# Patient Record
Sex: Male | Born: 1985 | Race: White | Hispanic: No | Marital: Married | Smoking: Former smoker
Health system: Southern US, Community
[De-identification: ages and names within clinical notes are randomized; demographics above are authoritative.]

## PROBLEM LIST (undated history)

## (undated) HISTORY — PX: NO PAST SURGERIES: SHX2092

---

## 2014-12-08 ENCOUNTER — Other Ambulatory Visit: Payer: Self-pay

## 2014-12-08 ENCOUNTER — Ambulatory Visit
Admission: EM | Admit: 2014-12-08 | Discharge: 2014-12-08 | Disposition: A | Discharge status: Home / Self Care | Payer: BLUE CROSS/BLUE SHIELD | Attending: Family Medicine | Admitting: Family Medicine

## 2014-12-08 DIAGNOSIS — L259 Unspecified contact dermatitis, unspecified cause: Secondary | ICD-10-CM

## 2014-12-08 DIAGNOSIS — R030 Elevated blood-pressure reading, without diagnosis of hypertension: Secondary | ICD-10-CM

## 2014-12-08 DIAGNOSIS — R21 Rash and other nonspecific skin eruption: Secondary | ICD-10-CM | POA: Diagnosis present

## 2014-12-08 DIAGNOSIS — IMO0001 Reserved for inherently not codable concepts without codable children: Secondary | ICD-10-CM

## 2014-12-08 MED ORDER — METHYLPREDNISOLONE SODIUM SUCC 125 MG IJ SOLR
62.5000 mg | Freq: Once | INTRAMUSCULAR | Status: AC
  Administered 2014-12-08: 62.5 mg via INTRAMUSCULAR

## 2014-12-08 MED ORDER — PREDNISONE 10 MG PO TABS: 20.0000 mg | ORAL_TABLET | Freq: Every day | ORAL | Status: AC

## 2014-12-08 NOTE — ED Provider Notes (Signed)
Patient presents today with very itchy red rash on his left arm and left torso area. He also has a lesion starting on his face. He states that he was moving some brush last week when the rash started. He has been trying to use some over-the-counter medication but has not helped much. He does admit to similar rash with poison ivy in the past. Patient does have elevated blood pressure in the office that he attributes to being nervous being at the doctor. He is also tachycardic which he states is for the same reason. He denies any chest pain or shortness of breath or any severe headache. He denies any slurred speech or weakness of any extremities. He admits to drinking a cup of coffee before coming into the office today. He denies any other drug use.  ROS: Negative except mentioned above. Vitals as per Epic.   GENERAL: NAD HEENT: no pharyngeal erythema, no exudate, no erythema of TMs, no cervical LAD RESP: CTA B CARD: tachycardic SKIN: erythematous raised slightly warm weeping area on left forearm, similar rash not weeping on left torso area and left face, no signs of secondary infection at this time  NEURO: CN II-XII grossly intact   ECG: HR 106, Sinus tachycardia, normal axis, no ST or T wave changes   A/P: 1)Skin Rash- appears to be contact dermatitis, Solu-Medrol 62.5 mg IM given in the office, will start oral steroid medication tomorrow, continuing antihistamine daily, would recommend that patient follow up tomorrow or Friday for recheck to make sure rash is improving and there is no secondary bacterial infection developing. 2) Elevated blood pressure- patient believes this is due to his nervousness in getting an injection today, also may be related to the caffeine that he had prior to coming in, would recommend that patient follow-up tomorrow for interval change of rash and also to have blood pressure rechecked. If blood pressure is still elevated would recommend follow-up with cardiology as  discussed with patient today. If any cardiac symptoms do develop in the interim he is to seek immediate medical attention.  Jolene Provost, MD 12/08/14 [phone]

## 2014-12-08 NOTE — ED Notes (Signed)
Moving brush/trees last week. + rash left arm and wrist that is very itchy. Various spots on other areas of body

## 2016-02-02 ENCOUNTER — Ambulatory Visit (INDEPENDENT_AMBULATORY_CARE_PROVIDER_SITE_OTHER): Payer: BLUE CROSS/BLUE SHIELD

## 2016-02-02 ENCOUNTER — Ambulatory Visit
Admission: EM | Admit: 2016-02-02 | Discharge: 2016-02-02 | Disposition: A | Discharge status: Home / Self Care | Payer: BLUE CROSS/BLUE SHIELD | Attending: Emergency Medicine | Admitting: Emergency Medicine

## 2016-02-02 DIAGNOSIS — J189 Pneumonia, unspecified organism: Secondary | ICD-10-CM

## 2016-02-02 DIAGNOSIS — J181 Lobar pneumonia, unspecified organism: Secondary | ICD-10-CM

## 2016-02-02 MED ORDER — BENZONATATE 200 MG PO CAPS: 200.0000 mg | ORAL_CAPSULE | Freq: Three times a day (TID) | ORAL | 0 refills | Status: AC | PRN

## 2016-02-02 MED ORDER — AZITHROMYCIN 250 MG PO TABS: 250.0000 mg | ORAL_TABLET | Freq: Every day | ORAL | 0 refills | Status: AC

## 2016-02-02 MED ORDER — IPRATROPIUM BROMIDE 0.06 % NA SOLN: 2.0000 | Freq: Four times a day (QID) | NASAL | 0 refills | Status: AC

## 2016-02-02 MED ORDER — AEROCHAMBER PLUS MISC: 2 refills | Status: AC

## 2016-02-02 MED ORDER — HYDROCOD POLST-CPM POLST ER 10-8 MG/5ML PO SUER: 5.0000 mL | Freq: Two times a day (BID) | ORAL | 0 refills | Status: AC | PRN

## 2016-02-02 MED ORDER — ALBUTEROL SULFATE HFA 108 (90 BASE) MCG/ACT IN AERS: 2.0000 | INHALATION_SPRAY | RESPIRATORY_TRACT | 0 refills | Status: AC | PRN

## 2016-02-02 NOTE — ED Provider Notes (Signed)
HPI  SUBJECTIVE:  Bryan Wilkins is a 30 y.o. male who presents with like symptoms starting 10 days ago. He reports acute onset of body aches, headaches, nasal congestion, fevers, MAXIMUM TEMPERATURE 101.8. He reports cough productive of yellowish sputum. He states that he got better, then got worse 3 days ago. Reports persistent cough, states the other things have largely resolved except for the nasal congestion. He reports one episode of posttussive emesis last night. He denies abdominal pain, and diarrhea, other nausea. No vomiting today. He is tolerating by mouth today. He denies wheezing, chest pain, shortness of breath. Reports chest soreness with coughing, no tightness, heaviness, pressure or pleuritic pain. No ear pain, headache, ST, neck stiffness, photophobia, sinus pain or pressure, calf pain, swelling, hemoptysis, surgery in the past 4 weeks, prolonged immobilization. He has been taking NyQuil, ibuprofen 400 mg, Aleve, Delsym. Symptoms are better with Aleve, no aggravating factors. He has no past medical history of asthma, emphysema, COPD, smoking, PE, DVT, diabetes. He states that his blood pressure normally runs 130/80's at home, says his heart rate and blood pressure is always high when he sees a doctor. States that the 120s is not unusual for him. PMD: None.   History reviewed. No pertinent past medical history.  Past Surgical History:  Procedure Laterality Date  . NO PAST SURGERIES      Family History  Problem Relation Age of Onset  . Hypertension Mother   . Rheum arthritis Mother   . Diabetes Father     Social History  Substance Use Topics  . Smoking status: Former Games developermoker  . Smokeless tobacco: Current User  . Alcohol use Yes     Comment: socially    No current facility-administered medications for this encounter.   Current Outpatient Prescriptions:  .  albuterol (PROVENTIL HFA;VENTOLIN HFA) 108 (90 Base) MCG/ACT inhaler, Inhale 2 puffs into the lungs every 4 (four)  hours as needed for wheezing or shortness of breath. Dispense with aerochamber, Disp: 1 Inhaler, Rfl: 0 .  azithromycin (ZITHROMAX) 250 MG tablet, Take 1 tablet (250 mg total) by mouth daily. 2 tabs po on day 1, 1 tab po on days 2-5, Disp: 6 tablet, Rfl: 0 .  benzonatate (TESSALON) 200 MG capsule, Take 1 capsule (200 mg total) by mouth 3 (three) times daily as needed for cough., Disp: 20 capsule, Rfl: 0 .  chlorpheniramine-HYDROcodone (TUSSIONEX PENNKINETIC ER) 10-8 MG/5ML SUER, Take 5 mLs by mouth every 12 (twelve) hours as needed for cough., Disp: 120 mL, Rfl: 0 .  ipratropium (ATROVENT) 0.06 % nasal spray, Place 2 sprays into both nostrils 4 (four) times daily. 3-4 times/ day, Disp: 15 mL, Rfl: 0 .  Spacer/Aero-Holding Chambers (AEROCHAMBER PLUS) inhaler, Use as instructed, Disp: 1 each, Rfl: 2  No Known Allergies   ROS  As noted in HPI.   Physical Exam  BP (!) 159/93 (BP Location: Left Arm)   Pulse (!) 124   Temp 98.2 F (36.8 C) (Oral)   Resp 17   Ht 6' (1.829 m)   Wt 280 lb (127 kg)   SpO2 96%   BMI 37.97 kg/m   Constitutional: Well developed, well nourished, no acute distress Eyes: PERRL, EOMI, conjunctiva normal bilaterally HENT: Normocephalic, atraumatic,mucus membranes moist. TMs not visualized due to cerumen. Mild nasal congestion, no sinus tenderness. Normal oropharynx. No postnasal drip noted.  Respiratory: Diffuse wheezing and rales. No chest wall tenderness good air movement Cardiovascular: Regular tachycardia no murmurs, no gallops, no rubs GI: Soft, nondistended,  normal bowel sounds, nontender, no rebound, no guarding Back: no CVAT skin: No rash, skin intact Musculoskeletal: Calves symmetric, nontender no edema, no tenderness, no deformities Neurologic: Alert & oriented x 3, CN II-XII grossly intact, no motor deficits, sensation grossly intact Psychiatric: Speech and behavior appropriate   ED Course   Medications - No data to display  Orders Placed This  Encounter  Procedures  . DG Chest 2 View    Standing Status:   Standing    Number of Occurrences:   1    Order Specific Question:   Reason for Exam (SYMPTOM  OR DIAGNOSIS REQUIRED)    Answer:   cough fever r/o PNA   No results found for this or any previous visit (from the past 24 hour(s)). Dg Chest 2 View  Result Date: 02/02/2016 CLINICAL DATA:  Cough and fever.  Former smoker. EXAM: CHEST  2 VIEW COMPARISON:  None in PACs FINDINGS: There is dense infiltrate peripherally in the inferior aspect of the right upper lobe. There is subtle increased density in the left perihilar region. There is no pleural effusion. The heart and pulmonary vascularity are normal. The bony thorax exhibits no acute abnormality. IMPRESSION: Right upper lobe pneumonia. Followup PA and lateral chest X-ray is recommended in 3-4 weeks following trial of antibiotic therapy to ensure resolution and exclude underlying malignancy. Electronically Signed   By: David  [place] M.D.   On: 02/02/2016 09:25    ED Clinical Impression  Community acquired pneumonia of right upper lobe of lung [facility])   ED Assessment/Plan  Due to the tachycardia and double sickening with the lung findings, suspect pneumonia. Doubt PE. He is in no respiratory distress, he is satting well on room air.  Checking chest x-ray. Reviewed imaging independently. Right upper lobe pneumonia. Recommends repeat chest x-ray in 3-4 weeks. See radiology report for details  Pt with  right upper lobe pneumonia. Plan to send home with albuterol with spacer, azithromycin, Tessalon, Tussionex, Atrovent nasal spray. Will provide primary care referral.  Discussed maging, MDM, plan and followup with patient. Discussed sn/sx that should prompt return to the ED. Patient agrees with plan.   Meds ordered this encounter  Medications  . azithromycin (ZITHROMAX) 250 MG tablet    Sig: Take 1 tablet (250 mg total) by mouth daily. 2 tabs po on day 1, 1 tab po on days 2-5     Dispense:  6 tablet    Refill:  0  . albuterol (PROVENTIL HFA;VENTOLIN HFA) 108 (90 Base) MCG/ACT inhaler    Sig: Inhale 2 puffs into the lungs every 4 (four) hours as needed for wheezing or shortness of breath. Dispense with aerochamber    Dispense:  1 Inhaler    Refill:  0  . chlorpheniramine-HYDROcodone (TUSSIONEX PENNKINETIC ER) 10-8 MG/5ML SUER    Sig: Take 5 mLs by mouth every 12 (twelve) hours as needed for cough.    Dispense:  120 mL    Refill:  0  . Spacer/Aero-Holding Chambers (AEROCHAMBER PLUS) inhaler    Sig: Use as instructed    Dispense:  1 each    Refill:  2  . ipratropium (ATROVENT) 0.06 % nasal spray    Sig: Place 2 sprays into both nostrils 4 (four) times daily. 3-4 times/ day    Dispense:  15 mL    Refill:  0  . benzonatate (TESSALON) 200 MG capsule    Sig: Take 1 capsule (200 mg total) by mouth 3 (three) times daily as needed for  cough.    Dispense:  20 capsule    Refill:  0    *This clinic note was created using Scientist, clinical (histocompatibility and immunogenetics)Dragon dictation software. Therefore, there may be occasional mistakes despite careful proofreading.  ?   Domenick GongAshley Eyob Godlewski, MD 02/02/16 [phone]

## 2016-02-02 NOTE — ED Triage Notes (Signed)
Patient reports that cough started last Monday. Patient states that he ran fevers all week. Patient states that he improved over Saturday and Sunday. Patient states that symptoms returned on Monday. Patient states that he is now coughing again, vomited last night. Patient states that he has been feeling feverish all week.

## 2018-07-14 IMAGING — CR DG CHEST 2V
2 series · 2 of 2 positions shown · non-contrast
Comparison: None in PACs

CLINICAL DATA: Cough and fever.  Former smoker.

EXAM:
CHEST  2 VIEW

[chest pa]
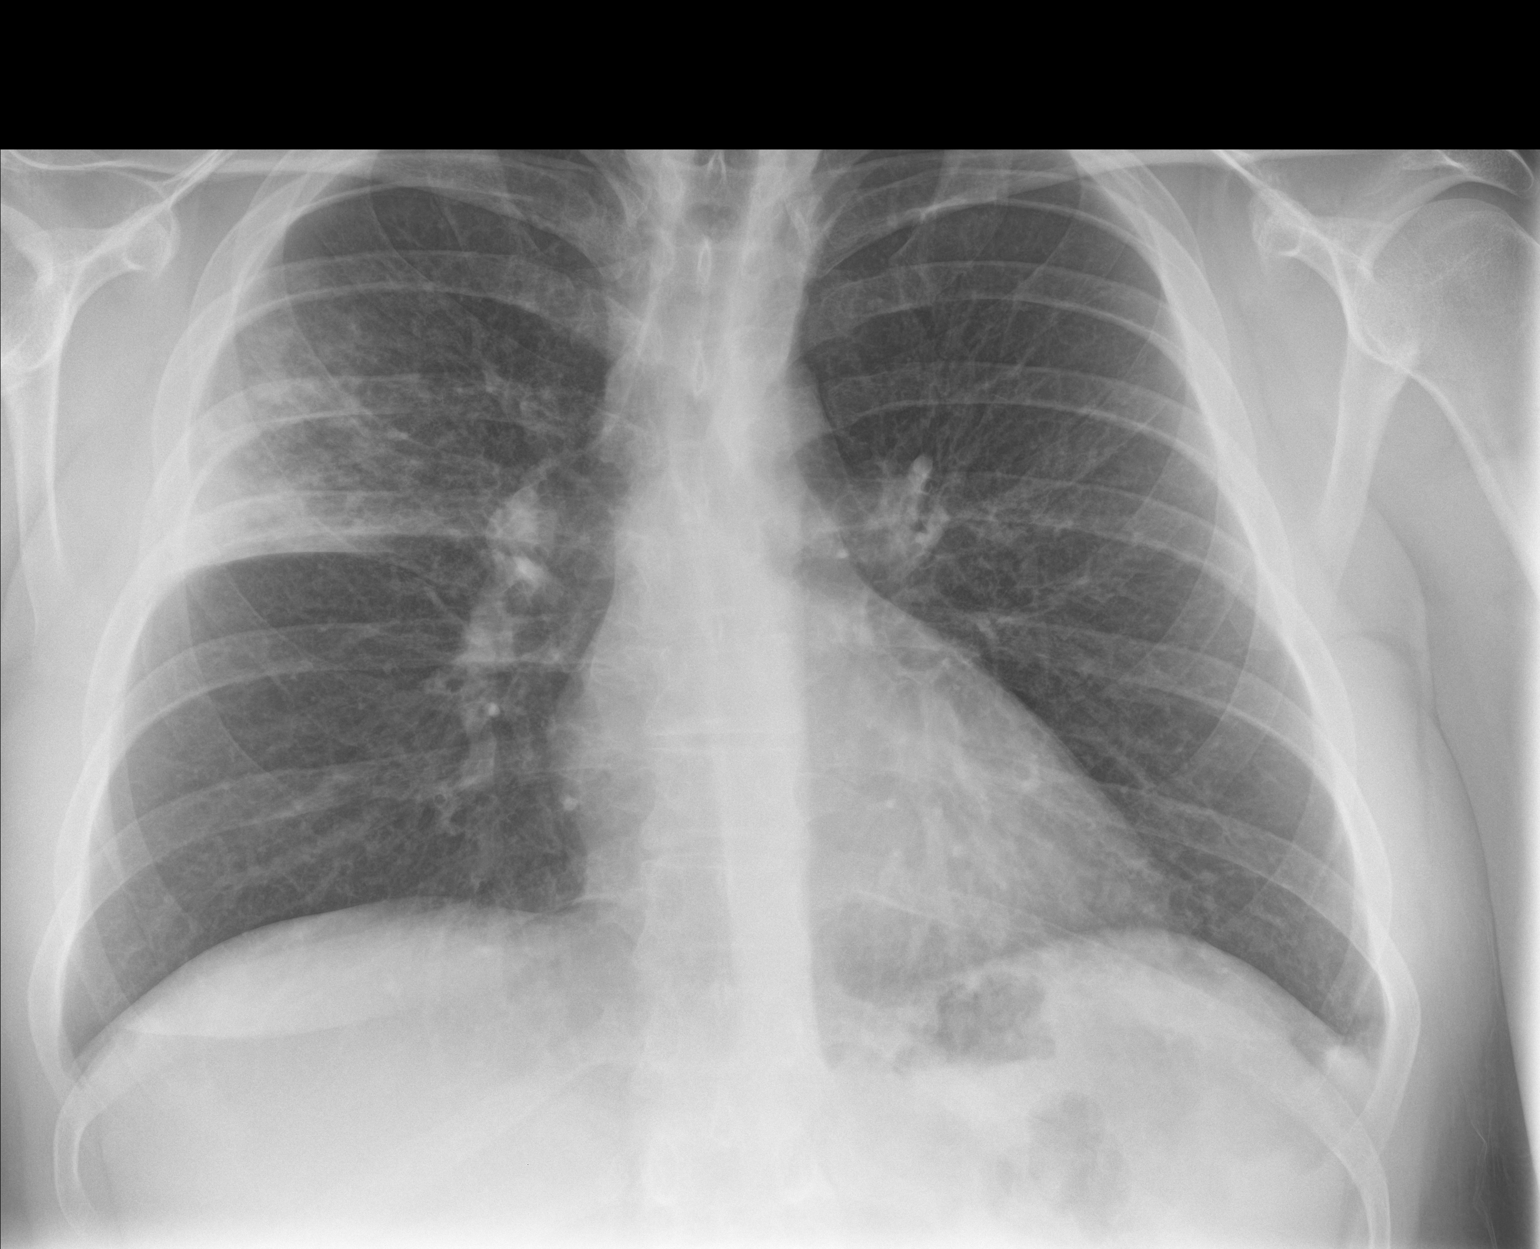

[chest lat]
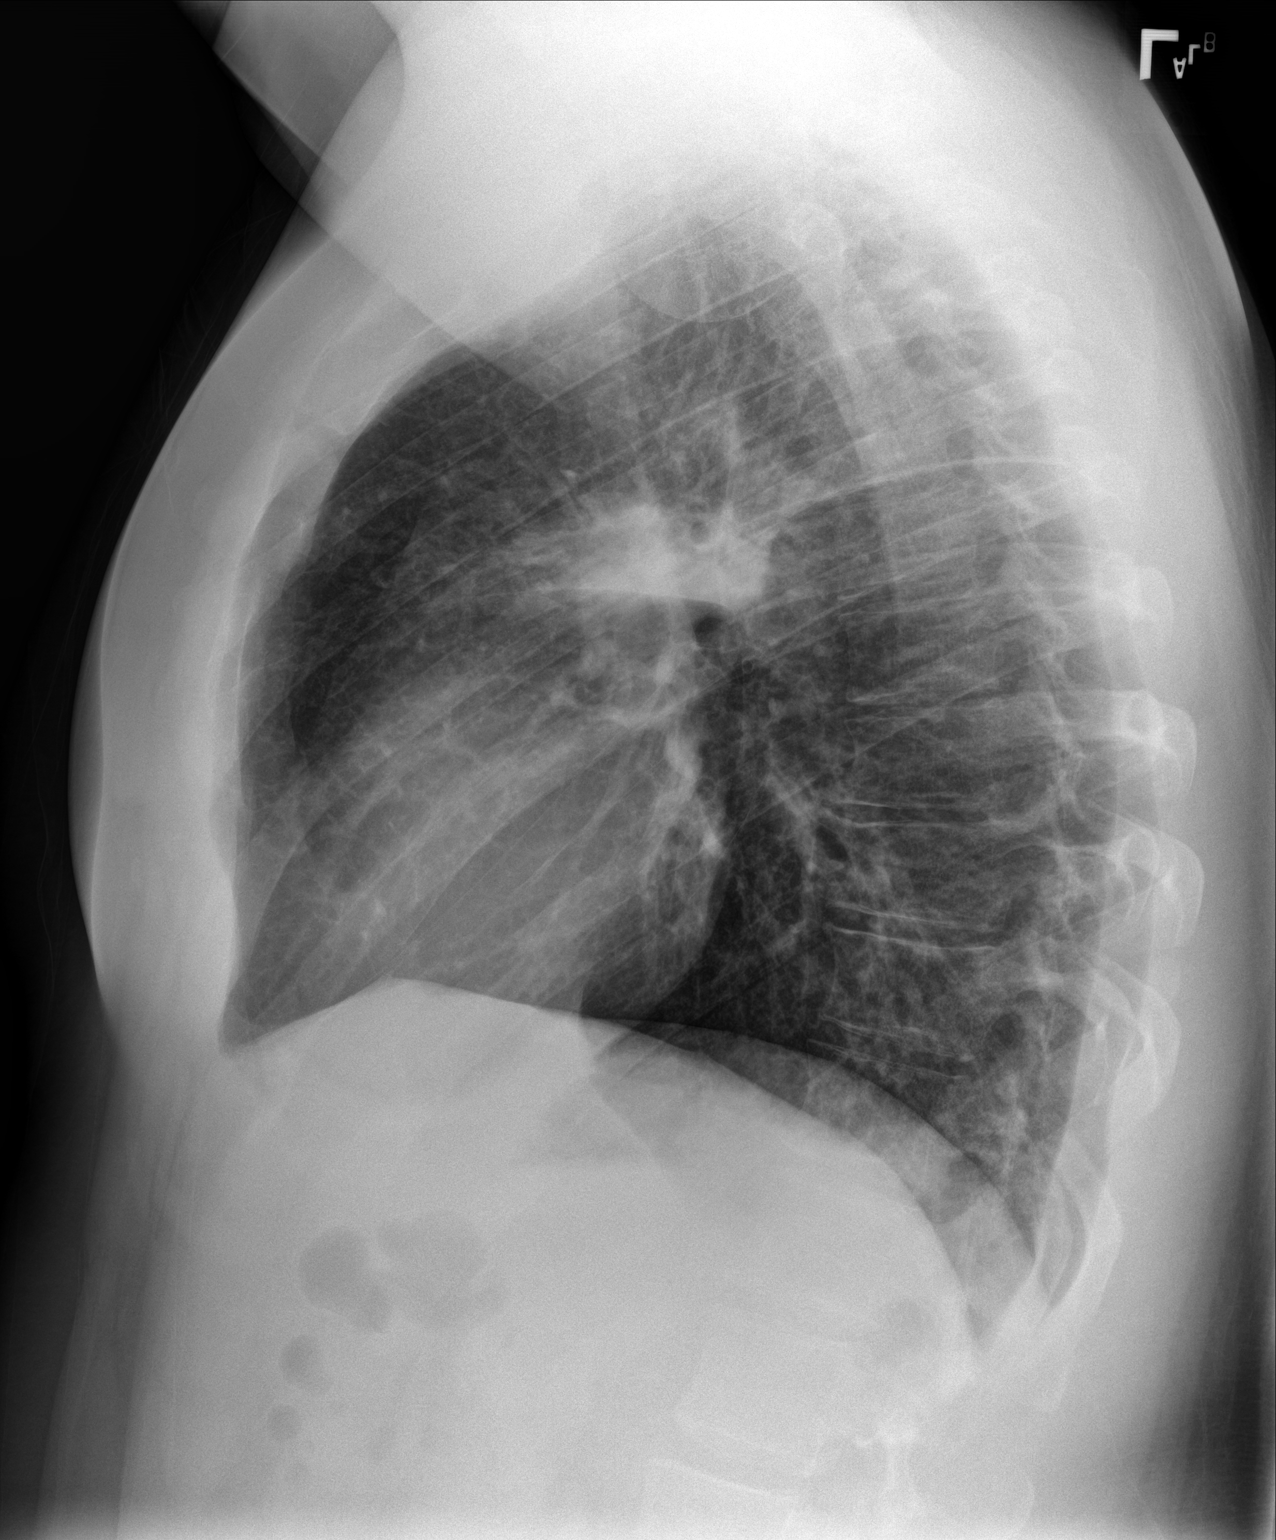

[2 of 2 positions shown; findings below may reference images not displayed]

FINDINGS: There is dense infiltrate peripherally in the inferior aspect of the
right upper lobe. There is subtle increased density in the left
perihilar region. There is no pleural effusion. The heart and
pulmonary vascularity are normal. The bony thorax exhibits no acute
abnormality.
IMPRESSION: Right upper lobe pneumonia. Followup PA and lateral chest X-ray is
recommended in 3-4 weeks following trial of antibiotic therapy to
ensure resolution and exclude underlying malignancy.

## 2019-05-16 ENCOUNTER — Ambulatory Visit: Payer: BLUE CROSS/BLUE SHIELD

## 2021-03-22 ENCOUNTER — Other Ambulatory Visit: Payer: Self-pay | Admitting: Family Medicine

## 2021-03-22 DIAGNOSIS — R7401 Elevation of levels of liver transaminase levels: Secondary | ICD-10-CM

## 2021-04-24 ENCOUNTER — Ambulatory Visit
Admission: RE | Admit: 2021-04-24 | Discharge: 2021-04-24 | Disposition: A | Discharge status: Home / Self Care | Payer: Managed Care, Other (non HMO) | Source: Ambulatory Visit | Attending: Family Medicine | Admitting: Family Medicine

## 2021-04-24 ENCOUNTER — Other Ambulatory Visit: Payer: Self-pay

## 2021-04-24 DIAGNOSIS — R7401 Elevation of levels of liver transaminase levels: Secondary | ICD-10-CM | POA: Diagnosis not present
